# Patient Record
Sex: Male | Born: 1995 | Race: Black or African American | Hispanic: No | Marital: Single
Health system: Southern US, Community
[De-identification: ages and names within clinical notes are randomized; demographics above are authoritative.]

---
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
## 2017-02-08 ENCOUNTER — Emergency Department (HOSPITAL_COMMUNITY): Payer: No Typology Code available for payment source

## 2017-02-08 ENCOUNTER — Encounter (HOSPITAL_COMMUNITY): Payer: Self-pay | Admitting: Emergency Medicine

## 2017-02-08 ENCOUNTER — Emergency Department (HOSPITAL_COMMUNITY)
Admission: EM | Admit: 2017-02-08 | Discharge: 2017-02-09 | Payer: No Typology Code available for payment source | Attending: Emergency Medicine | Admitting: Emergency Medicine

## 2017-02-08 DIAGNOSIS — Y999 Unspecified external cause status: Secondary | ICD-10-CM | POA: Diagnosis not present

## 2017-02-08 DIAGNOSIS — S3282XA Multiple fractures of pelvis without disruption of pelvic ring, initial encounter for closed fracture: Secondary | ICD-10-CM | POA: Insufficient documentation

## 2017-02-08 DIAGNOSIS — S7222XA Displaced subtrochanteric fracture of left femur, initial encounter for closed fracture: Secondary | ICD-10-CM | POA: Insufficient documentation

## 2017-02-08 DIAGNOSIS — S82202A Unspecified fracture of shaft of left tibia, initial encounter for closed fracture: Secondary | ICD-10-CM | POA: Diagnosis not present

## 2017-02-08 DIAGNOSIS — Z7722 Contact with and (suspected) exposure to environmental tobacco smoke (acute) (chronic): Secondary | ICD-10-CM | POA: Insufficient documentation

## 2017-02-08 DIAGNOSIS — S82402A Unspecified fracture of shaft of left fibula, initial encounter for closed fracture: Secondary | ICD-10-CM | POA: Insufficient documentation

## 2017-02-08 DIAGNOSIS — S02609B Fracture of mandible, unspecified, initial encounter for open fracture: Secondary | ICD-10-CM | POA: Diagnosis not present

## 2017-02-08 DIAGNOSIS — Y929 Unspecified place or not applicable: Secondary | ICD-10-CM | POA: Diagnosis not present

## 2017-02-08 DIAGNOSIS — Y939 Activity, unspecified: Secondary | ICD-10-CM | POA: Diagnosis not present

## 2017-02-08 LAB — URINALYSIS, ROUTINE W REFLEX MICROSCOPIC
BACTERIA UA: NONE SEEN
Bilirubin Urine: NEGATIVE
Glucose, UA: 50 mg/dL — AB
Ketones, ur: NEGATIVE mg/dL
Leukocytes, UA: NEGATIVE
Nitrite: NEGATIVE
PROTEIN: 100 mg/dL — AB
Specific Gravity, Urine: >1.046 — ABNORMAL HIGH (ref 1.005–1.030)
Squamous Epithelial / LPF: NONE SEEN
pH: 6 (ref 5.0–8.0)

## 2017-02-08 LAB — CBC
HCT: 36.6 % — ABNORMAL LOW (ref 39.0–52.0)
HEMOGLOBIN: 12.7 g/dL — AB (ref 13.0–17.0)
MCH: 30.5 pg (ref 26.0–34.0)
MCHC: 34.7 g/dL (ref 30.0–36.0)
MCV: 87.8 fL (ref 78.0–100.0)
Platelets: 178 10*3/uL (ref 150–400)
RBC: 4.17 MIL/uL — ABNORMAL LOW (ref 4.22–5.81)
RDW: 11.9 % (ref 11.5–15.5)
WBC: 21.4 10*3/uL — ABNORMAL HIGH (ref 4.0–10.5)

## 2017-02-08 LAB — I-STAT CHEM 8, ED
BUN: 6 mg/dL (ref 6–20)
CREATININE: 1.3 mg/dL — AB (ref 0.61–1.24)
Calcium, Ion: 0.96 mmol/L — ABNORMAL LOW (ref 1.15–1.40)
Chloride: 105 mmol/L (ref 101–111)
Glucose, Bld: 185 mg/dL — ABNORMAL HIGH (ref 65–99)
HEMATOCRIT: 36 % — AB (ref 39.0–52.0)
Hemoglobin: 12.2 g/dL — ABNORMAL LOW (ref 13.0–17.0)
Potassium: 3.2 mmol/L — ABNORMAL LOW (ref 3.5–5.1)
Sodium: 140 mmol/L (ref 135–145)
TCO2: 20 mmol/L (ref 0–100)

## 2017-02-08 LAB — PROTIME-INR
INR: 1.31
Prothrombin Time: 16.4 seconds — ABNORMAL HIGH (ref 11.4–15.2)

## 2017-02-08 LAB — I-STAT CG4 LACTIC ACID, ED: LACTIC ACID, VENOUS: 5.45 mmol/L — AB (ref 0.5–1.9)

## 2017-02-08 LAB — SAMPLE TO BLOOD BANK

## 2017-02-08 LAB — ETHANOL: Alcohol, Ethyl (B): <5 mg/dL (ref <5)

## 2017-02-08 MED ORDER — TETANUS-DIPHTH-ACELL PERTUSSIS 5-2.5-18.5 LF-MCG/0.5 IM SUSP
0.5000 mL | Freq: Once | INTRAMUSCULAR | Status: AC
  Administered 2017-02-08: 0.5 mL via INTRAMUSCULAR
  Filled 2017-02-08: qty 0.5

## 2017-02-08 MED ORDER — SODIUM CHLORIDE 0.9 % IV SOLN
1.5000 g | Freq: Once | INTRAVENOUS | Status: AC
  Administered 2017-02-08: 1.5 g via INTRAVENOUS
  Filled 2017-02-08: qty 1.5

## 2017-02-08 MED ORDER — FENTANYL CITRATE (PF) 100 MCG/2ML IJ SOLN
100.0000 ug | Freq: Once | INTRAMUSCULAR | Status: AC
  Administered 2017-02-08: 100 ug via INTRAVENOUS

## 2017-02-08 MED ORDER — LACTATED RINGERS IV BOLUS (SEPSIS)
1000.0000 mL | Freq: Once | INTRAVENOUS | Status: AC
  Administered 2017-02-08: 1000 mL via INTRAVENOUS

## 2017-02-08 MED ORDER — IOPAMIDOL (ISOVUE-370) INJECTION 76%
INTRAVENOUS | Status: AC
  Administered 2017-02-08: 50 mL
  Filled 2017-02-08: qty 50

## 2017-02-08 MED ORDER — CEFAZOLIN SODIUM-DEXTROSE 1-4 GM/50ML-% IV SOLN
1.0000 g | Freq: Once | INTRAVENOUS | Status: AC
  Administered 2017-02-08: 1 g via INTRAVENOUS
  Filled 2017-02-08: qty 50

## 2017-02-08 MED ORDER — SODIUM CHLORIDE 0.9 % IV BOLUS (SEPSIS)
1000.0000 mL | Freq: Once | INTRAVENOUS | Status: AC
  Administered 2017-02-08: 1000 mL via INTRAVENOUS

## 2017-02-08 MED ORDER — FENTANYL CITRATE (PF) 100 MCG/2ML IJ SOLN
INTRAMUSCULAR | Status: AC
  Filled 2017-02-08: qty 2

## 2017-02-08 MED ORDER — IOPAMIDOL (ISOVUE-300) INJECTION 61%
INTRAVENOUS | Status: AC
  Administered 2017-02-08: 80 mL
  Filled 2017-02-08: qty 100

## 2017-02-08 MED ORDER — FENTANYL CITRATE (PF) 100 MCG/2ML IJ SOLN
INTRAMUSCULAR | Status: AC
  Administered 2017-02-08: 100 ug
  Filled 2017-02-08: qty 2
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
## 2017-02-08 NOTE — Progress Notes (Signed)
Orthopedic Tech Progress Note Patient Details:  Tyler Daugherty [birth year] [id number]  Ortho Devices Type of Ortho Device: Post (long leg) splint Ortho Device/Splint Location: applied long leg splint to pt left leg with doctor assistance.  pt tolerated application well.  Left leg.  Ortho Device/Splint Interventions: Application   Alvina ChouWilliams, Hellon Vaccarella C 02/08/2017, 9:11 PM

## 2017-02-08 NOTE — Progress Notes (Signed)
Orthopedic Tech Progress Note Patient Details:  Warnell ForesterKhalil J Errico 01-Aug-1996 [id number]  Ortho Devices Type of Ortho Device: Volar splint Ortho Device/Splint Location: rue Ortho Device/Splint Interventions: Ordered, Application, Adjustment   Trinna PostMartinez, Helia Haese J 02/08/2017, 11:58 PM

## 2017-02-08 NOTE — ED Notes (Signed)
Pt hanging out of bed holding penis and peeing on floor; patient cleaned up and pulled back into bed. EMT tech applying condom cath.
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
## 2017-02-08 NOTE — ED Notes (Signed)
Pt to CT on CCM with RN, pt increasingly difficult to arouse.

## 2017-02-08 NOTE — ED Notes (Signed)
Ortho in room
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
## 2017-02-08 NOTE — ED Notes (Signed)
Ortho tech paged for wrist splint

## 2017-02-08 NOTE — ED Notes (Signed)
Sister and cousin at bedside.  Ortho at bedside

## 2017-02-08 NOTE — ED Notes (Signed)
Ortho tech called advised of long leg splint when patient completes CT's.

## 2017-02-09 LAB — RAPID URINE DRUG SCREEN, HOSP PERFORMED
AMPHETAMINES: NOT DETECTED
Barbiturates: NOT DETECTED
Benzodiazepines: NOT DETECTED
Cocaine: NOT DETECTED
OPIATES: NOT DETECTED
Tetrahydrocannabinol: POSITIVE — AB

## 2017-02-09 NOTE — ED Notes (Signed)
[facility] at bedside for transport to [facility]
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
## 2017-02-09 NOTE — ED Notes (Signed)
[facility] will be [facility] by [facility]
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
## 2017-02-09 NOTE — ED Notes (Signed)
[facility]. VSS.
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
## 2019-10-29 ENCOUNTER — Ambulatory Visit: Payer: Self-pay | Attending: Internal Medicine
